# Patient Record
Sex: Female | Born: 1955 | Race: White | Hispanic: No | Marital: Married | Smoking: Never smoker
Health system: Southern US, Community
[De-identification: ages and names within clinical notes are randomized; demographics above are authoritative.]

## PROBLEM LIST (undated history)

## (undated) DIAGNOSIS — D649 Anemia, unspecified: Secondary | ICD-10-CM

## (undated) DIAGNOSIS — T7840XA Allergy, unspecified, initial encounter: Secondary | ICD-10-CM

## (undated) DIAGNOSIS — K5900 Constipation, unspecified: Secondary | ICD-10-CM

## (undated) HISTORY — DX: Anemia, unspecified: D64.9

## (undated) HISTORY — PX: BREAST LUMPECTOMY: SHX2

## (undated) HISTORY — PX: HEMORRHOID SURGERY: SHX153

## (undated) HISTORY — PX: CERVICAL CONE BIOPSY: SUR198

## (undated) HISTORY — DX: Allergy, unspecified, initial encounter: T78.40XA

## (undated) HISTORY — DX: Constipation, unspecified: K59.00

---

## 1997-12-20 ENCOUNTER — Ambulatory Visit (HOSPITAL_COMMUNITY): Admission: RE | Admit: 1997-12-20 | Discharge: 1997-12-20 | Payer: Self-pay | Admitting: *Deleted

## 1999-06-04 ENCOUNTER — Ambulatory Visit (HOSPITAL_COMMUNITY): Admission: RE | Admit: 1999-06-04 | Discharge: 1999-06-04 | Payer: Self-pay | Admitting: *Deleted

## 1999-06-04 ENCOUNTER — Encounter: Payer: Self-pay | Admitting: *Deleted

## 1999-06-05 ENCOUNTER — Ambulatory Visit (HOSPITAL_COMMUNITY): Admission: RE | Admit: 1999-06-05 | Discharge: 1999-06-05 | Payer: Self-pay

## 1999-06-05 ENCOUNTER — Encounter: Payer: Self-pay | Admitting: *Deleted

## 1999-07-31 ENCOUNTER — Other Ambulatory Visit: Admission: RE | Admit: 1999-07-31 | Discharge: 1999-07-31 | Payer: Self-pay | Admitting: General Surgery

## 1999-12-23 ENCOUNTER — Ambulatory Visit (HOSPITAL_COMMUNITY): Admission: RE | Admit: 1999-12-23 | Discharge: 1999-12-23 | Payer: Self-pay | Admitting: *Deleted

## 1999-12-23 ENCOUNTER — Encounter: Payer: Self-pay | Admitting: *Deleted

## 1999-12-26 ENCOUNTER — Other Ambulatory Visit: Admission: RE | Admit: 1999-12-26 | Discharge: 1999-12-26 | Payer: Self-pay | Admitting: *Deleted

## 2000-12-24 ENCOUNTER — Ambulatory Visit (HOSPITAL_COMMUNITY): Admission: RE | Admit: 2000-12-24 | Discharge: 2000-12-24 | Payer: Self-pay | Admitting: *Deleted

## 2000-12-24 ENCOUNTER — Encounter: Payer: Self-pay | Admitting: *Deleted

## 2001-09-14 ENCOUNTER — Ambulatory Visit (HOSPITAL_COMMUNITY): Admission: RE | Admit: 2001-09-14 | Discharge: 2001-09-14 | Payer: Self-pay | Admitting: General Surgery

## 2001-12-27 ENCOUNTER — Ambulatory Visit (HOSPITAL_COMMUNITY): Admission: RE | Admit: 2001-12-27 | Discharge: 2001-12-27 | Payer: Self-pay | Admitting: *Deleted

## 2001-12-27 ENCOUNTER — Encounter: Payer: Self-pay | Admitting: *Deleted

## 2003-03-02 ENCOUNTER — Other Ambulatory Visit: Admission: RE | Admit: 2003-03-02 | Discharge: 2003-03-02 | Payer: Self-pay | Admitting: Obstetrics and Gynecology

## 2004-05-20 ENCOUNTER — Other Ambulatory Visit: Admission: RE | Admit: 2004-05-20 | Discharge: 2004-05-20 | Payer: Self-pay | Admitting: Obstetrics and Gynecology

## 2005-01-15 ENCOUNTER — Ambulatory Visit (HOSPITAL_COMMUNITY): Admission: RE | Admit: 2005-01-15 | Discharge: 2005-01-15 | Payer: Self-pay | Admitting: Obstetrics and Gynecology

## 2005-03-13 ENCOUNTER — Emergency Department (HOSPITAL_COMMUNITY): Admission: EM | Admit: 2005-03-13 | Discharge: 2005-03-13 | Payer: Self-pay | Admitting: Emergency Medicine

## 2006-08-30 ENCOUNTER — Ambulatory Visit (HOSPITAL_COMMUNITY): Admission: RE | Admit: 2006-08-30 | Discharge: 2006-08-30 | Payer: Self-pay | Admitting: Obstetrics and Gynecology

## 2006-09-02 ENCOUNTER — Ambulatory Visit: Payer: Self-pay | Admitting: Gastroenterology

## 2006-09-02 HISTORY — PX: COLONOSCOPY: SHX174

## 2006-09-10 ENCOUNTER — Encounter: Admission: RE | Admit: 2006-09-10 | Discharge: 2006-09-10 | Payer: Self-pay | Admitting: Obstetrics and Gynecology

## 2006-09-15 ENCOUNTER — Ambulatory Visit: Payer: Self-pay | Admitting: Gastroenterology

## 2012-04-13 ENCOUNTER — Other Ambulatory Visit: Payer: Self-pay | Admitting: Obstetrics and Gynecology

## 2013-04-17 ENCOUNTER — Other Ambulatory Visit: Payer: Self-pay | Admitting: Obstetrics and Gynecology

## 2014-04-18 ENCOUNTER — Other Ambulatory Visit: Payer: Self-pay | Admitting: Obstetrics and Gynecology

## 2014-04-19 LAB — CYTOLOGY - PAP

## 2015-10-13 ENCOUNTER — Encounter (HOSPITAL_BASED_OUTPATIENT_CLINIC_OR_DEPARTMENT_OTHER): Payer: Self-pay

## 2015-10-13 ENCOUNTER — Emergency Department (HOSPITAL_BASED_OUTPATIENT_CLINIC_OR_DEPARTMENT_OTHER)
Admission: EM | Admit: 2015-10-13 | Discharge: 2015-10-13 | Disposition: A | Discharge status: Home / Self Care | Payer: 59 | Attending: Emergency Medicine | Admitting: Emergency Medicine

## 2015-10-13 DIAGNOSIS — Z79899 Other long term (current) drug therapy: Secondary | ICD-10-CM | POA: Insufficient documentation

## 2015-10-13 DIAGNOSIS — M545 Low back pain: Secondary | ICD-10-CM | POA: Diagnosis present

## 2015-10-13 DIAGNOSIS — Z791 Long term (current) use of non-steroidal anti-inflammatories (NSAID): Secondary | ICD-10-CM | POA: Diagnosis not present

## 2015-10-13 MED ORDER — ONDANSETRON 8 MG PO TBDP
8.0000 mg | ORAL_TABLET | Freq: Once | ORAL | Status: AC
  Administered 2015-10-13: 8 mg via ORAL
  Filled 2015-10-13: qty 1

## 2015-10-13 MED ORDER — HYDROCODONE-ACETAMINOPHEN 5-325 MG PO TABS: 1.0000 | ORAL_TABLET | Freq: Four times a day (QID) | ORAL | Status: DC | PRN

## 2015-10-13 MED ORDER — HYDROCODONE-ACETAMINOPHEN 5-325 MG PO TABS
1.0000 | ORAL_TABLET | Freq: Once | ORAL | Status: AC
  Administered 2015-10-13: 1 via ORAL
  Filled 2015-10-13: qty 1

## 2015-10-13 MED ORDER — ONDANSETRON 8 MG PO TBDP: 8.0000 mg | ORAL_TABLET | Freq: Three times a day (TID) | ORAL | Status: DC | PRN

## 2015-10-13 NOTE — Discharge Instructions (Signed)

## 2015-10-13 NOTE — ED Provider Notes (Signed)
CSN: [id number]     Arrival date & time 10/13/15  0133 History   First MD Initiated Contact with Patient 10/13/15 0243     Chief Complaint  Patient presents with  . Back Pain     (Consider location/radiation/quality/duration/timing/severity/associated sxs/prior Treatment) HPI  This is a 59 year old female who injured her back on December 2. She was painting and reached forward and had the sudden onset of pain in her lumbar region. She saw her PCP subsequently and was given a shot of steroids and placed on tizanidine and a nonsteroidal. She had some improvement until yesterday her pain acutely worsened. There was no new injury. She has difficulty localizing the pain is sometimes is more intense on the right and sometimes more intense on the left. The pain is worst when she attempts to stand from a sitting position. This causes severe pain. Pain is much milder when sitting or lying. There is no associated numbness or weakness of the lower extremities. There have been no bowel or bladder changes.  History reviewed. No pertinent past medical history. History reviewed. No pertinent past surgical history. History reviewed. No pertinent family history. Social History  Substance Use Topics  . Smoking status: Never Smoker   . Smokeless tobacco: None  . Alcohol Use: No   OB History    No data available     Review of Systems  All other systems reviewed and are negative.   Allergies  Codeine  Home Medications   Prior to Admission medications   Medication Sig Start Date End Date Taking? Authorizing Provider  Cetirizine HCl (ZYRTEC ALLERGY PO) Take by mouth.   Yes Historical Provider, MD  diclofenac (VOLTAREN) 75 MG EC tablet Take 75 mg by mouth 2 (two) times daily.   Yes Historical Provider, MD  geriatric multivitamins-minerals (ELDERTONIC/GEVRABON) ELIX Take 15 mLs by mouth daily.   Yes Historical Provider, MD  TIZANIDINE HCL PO Take by mouth.   Yes Historical Provider, MD   BP 167/87  mmHg  Pulse 68  Temp(Src) 98.4 F (36.9 C) (Oral)  Resp 20  Ht 5\' 9"  (1.753 m)  Wt 191 lb (86.637 kg)  BMI 28.19 kg/m2  SpO2 100%   Physical Exam  General: Well-developed, well-nourished female in no acute distress; appearance consistent with age of record HENT: normocephalic; atraumatic Eyes: pupils equal, round and reactive to light; extraocular muscles intact Neck: supple Heart: regular rate and rhythm Lungs: clear to auscultation bilaterally Abdomen: soft; nondistended; nontender; bowel sounds present Back: No lumbar tenderness; negative straight leg raise bilaterally; pain on standing Extremities: No deformity; full range of motion Neurologic: Awake, alert and oriented; motor function intact in all extremities and symmetric; sensation intact and symmetric in the lower extremities; no facial droop Skin: Warm and dry Psychiatric: Normal mood and affect    ED Course  Procedures (including critical care time)   MDM  We'll add a narcotic and refer her back to her PCP. She was advised that an MRI may be indicated this time.   Shanon Rosser, MD 10/13/15 [phone]

## 2015-10-13 NOTE — ED Notes (Signed)
"  feel better", denies questions or needs, given Rx x2, family at [facility].

## 2015-10-13 NOTE — ED Notes (Signed)
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 Aleve and muscle relaxer's are ineffective per patient.

## 2015-10-16 ENCOUNTER — Other Ambulatory Visit: Payer: Self-pay | Admitting: Family Medicine

## 2015-10-16 DIAGNOSIS — M545 Low back pain: Secondary | ICD-10-CM

## 2015-10-25 ENCOUNTER — Ambulatory Visit
Admission: RE | Admit: 2015-10-25 | Discharge: 2015-10-25 | Disposition: A | Discharge status: Home / Self Care | Payer: 59 | Source: Ambulatory Visit | Attending: Family Medicine | Admitting: Family Medicine

## 2015-10-25 DIAGNOSIS — M545 Low back pain: Secondary | ICD-10-CM

## 2016-06-15 ENCOUNTER — Encounter: Payer: Self-pay | Admitting: Gastroenterology

## 2016-08-06 ENCOUNTER — Encounter: Payer: Self-pay | Admitting: Gastroenterology

## 2016-10-02 ENCOUNTER — Ambulatory Visit (AMBULATORY_SURGERY_CENTER): Payer: Self-pay | Admitting: *Deleted

## 2016-10-02 ENCOUNTER — Encounter: Payer: Self-pay | Admitting: Gastroenterology

## 2016-10-02 VITALS — Ht 69.0 in | Wt 198.0 lb

## 2016-10-02 DIAGNOSIS — Z1211 Encounter for screening for malignant neoplasm of colon: Secondary | ICD-10-CM

## 2016-10-02 MED ORDER — NA SULFATE-K SULFATE-MG SULF 17.5-3.13-1.6 GM/177ML PO SOLN: 1.0000 | Freq: Once | ORAL | 0 refills | Status: AC

## 2016-10-02 NOTE — Progress Notes (Signed)
No egg or soy allergy known to patient  No issues with past sedation with any surgeries  or procedures, no intubation problems   diet pills per patient that contain phentermine- off x 10 days before colon  No home 02 use per patient  No blood thinners per patient  Pt denies issues with constipation  No A fib or A flutter  Constipation issues and poor prep last colon 2007 so 2 day prep - last exam states fair compromised exam

## 2016-10-16 ENCOUNTER — Encounter: Payer: Self-pay | Admitting: Gastroenterology

## 2016-10-16 ENCOUNTER — Ambulatory Visit (AMBULATORY_SURGERY_CENTER): Payer: 59 | Admitting: Gastroenterology

## 2016-10-16 VITALS — BP 129/73 | HR 65 | Temp 98.6°F | Resp 12 | Ht 69.0 in | Wt 198.0 lb

## 2016-10-16 DIAGNOSIS — D122 Benign neoplasm of ascending colon: Secondary | ICD-10-CM

## 2016-10-16 DIAGNOSIS — Z1211 Encounter for screening for malignant neoplasm of colon: Secondary | ICD-10-CM | POA: Diagnosis present

## 2016-10-16 DIAGNOSIS — Z1212 Encounter for screening for malignant neoplasm of rectum: Secondary | ICD-10-CM | POA: Diagnosis not present

## 2016-10-16 DIAGNOSIS — K635 Polyp of colon: Secondary | ICD-10-CM | POA: Diagnosis not present

## 2016-10-16 DIAGNOSIS — D124 Benign neoplasm of descending colon: Secondary | ICD-10-CM

## 2016-10-16 MED ORDER — SODIUM CHLORIDE 0.9 % IV SOLN: 500.0000 mL | INTRAVENOUS | Status: AC

## 2016-10-16 NOTE — Patient Instructions (Signed)
Impression/Recommendations:  Polyp handout given to patient. Hemorrhoid handout given to patient.  Repeat colonoscopy in 5-10 years based on pathology report.  YOU HAD AN ENDOSCOPIC PROCEDURE TODAY AT [facility]:   Refer to the procedure report that was given to you for any specific questions about what was found during the examination.  If the procedure report does not answer your questions, please call your gastroenterologist to clarify.  If you requested that your care partner not be given the details of your procedure findings, then the procedure report has been included in a sealed envelope for you to review at your convenience later.  YOU SHOULD EXPECT: Some feelings of bloating in the abdomen. Passage of more gas than usual.  Walking can help get rid of the air that was put into your GI tract during the procedure and reduce the bloating. If you had a lower endoscopy (such as a colonoscopy or flexible sigmoidoscopy) you may notice spotting of blood in your stool or on the toilet paper. If you underwent a bowel prep for your procedure, you may not have a normal bowel movement for a few days.  Please Note:  You might notice some irritation and congestion in your nose or some drainage.  This is from the oxygen used during your procedure.  There is no need for concern and it should clear up in a day or so.  SYMPTOMS TO REPORT IMMEDIATELY:   Following lower endoscopy (colonoscopy or flexible sigmoidoscopy):  Excessive amounts of blood in the stool  Significant tenderness or worsening of abdominal pains  Swelling of the abdomen that is new, acute  Fever of 100F or higher For urgent or emergent issues, a gastroenterologist can be reached at any hour by calling [phone].   DIET:  We do recommend a small meal at first, but then you may proceed to your regular diet.  Drink plenty of fluids but you should avoid alcoholic beverages for 24 hours.  ACTIVITY:  You should  plan to take it easy for the rest of today and you should NOT DRIVE or use heavy machinery until tomorrow (because of the sedation medicines used during the test).    FOLLOW UP: Our staff will call the number listed on your records the next business day following your procedure to check on you and address any questions or concerns that you may have regarding the information given to you following your procedure. If we do not reach you, we will leave a message.  However, if you are feeling well and you are not experiencing any problems, there is no need to return our call.  We will assume that you have returned to your regular daily activities without incident.  If any biopsies were taken you will be contacted by phone or by letter within the next 1-3 weeks.  Please call us at [phone] if you have not heard about the biopsies in 3 weeks.    SIGNATURES/CONFIDENTIALITY: You and/or your care partner have signed paperwork which will be entered into your electronic medical record.  These signatures attest to the fact that that the information above on your After Visit Summary has been reviewed and is understood.  Full responsibility of the confidentiality of this discharge information lies with you and/or your care-partner.

## 2016-10-16 NOTE — Op Note (Signed)
[place] [facility] Patient Name: Tiffany Orr Procedure Date: 10/16/2016 8:34 AM MRN: [id number] Endoscopist: Mauri Pole , MD Age: 60 Referring MD:  Date of Birth: 29-Jan-1956 Gender: Female Account #: [id number] Procedure:                Colonoscopy Indications:              Screening for colorectal malignant neoplasm, Last                            colonoscopy: 2007 Medicines:                Monitored Anesthesia Care Procedure:                Pre-Anesthesia Assessment:                           - Prior to the procedure, a History and Physical                            was performed, and patient medications and                            allergies were reviewed. The patient's tolerance of                            previous anesthesia was also reviewed. The risks                            and benefits of the procedure and the sedation                            options and risks were discussed with the patient.                            All questions were answered, and informed consent                            was obtained. Prior Anticoagulants: The patient has                            taken no previous anticoagulant or antiplatelet                            agents. ASA Grade Assessment: II - A patient with                            mild systemic disease. After reviewing the risks                            and benefits, the patient was deemed in                            satisfactory condition to undergo the procedure.  After obtaining informed consent, the colonoscope                            was passed under direct vision. Throughout the                            procedure, the patient's blood pressure, pulse, and                            oxygen saturations were monitored continuously. The                            Model CF-HQ190L [id number]) scope was introduced                            through the anus and advanced to  the the terminal                            ileum, with identification of the appendiceal                            orifice and IC valve. The colonoscopy was performed                            without difficulty. The patient tolerated the                            procedure well. The quality of the bowel                            preparation was good. The terminal ileum, ileocecal                            valve, appendiceal orifice, and rectum were                            photographed. Scope In: 8:42:14 AM Scope Out: 9:05:35 AM Scope Withdrawal Time: 0 hours 12 minutes 39 seconds  Total Procedure Duration: 0 hours 23 minutes 21 seconds  Findings:                 The perianal and digital rectal examinations were                            normal.                           A 6 mm polyp was found in the ascending colon. The                            polyp was sessile. The polyp was removed with a                            cold snare. Resection and retrieval were complete.  Two sessile polyps were found in the descending                            colon. The polyps were 2 to 3 mm in size. These                            polyps were removed with a cold biopsy forceps.                            Resection and retrieval were complete.                           Non-bleeding internal hemorrhoids were found during                            retroflexion. The hemorrhoids were small. Complications:            No immediate complications. Estimated Blood Loss:     Estimated blood loss was minimal. Impression:               - One 6 mm polyp in the ascending colon, removed                            with a cold snare. Resected and retrieved.                           - Two 2 to 3 mm polyps in the descending colon,                            removed with a cold biopsy forceps. Resected and                            retrieved.                           -  Non-bleeding internal hemorrhoids. Recommendation:           - Patient has a contact number available for                            emergencies. The signs and symptoms of potential                            delayed complications were discussed with the                            patient. Return to normal activities tomorrow.                            Written discharge instructions were provided to the                            patient.                           -  Resume previous diet.                           - Continue present medications.                           - Await pathology results.                           - Repeat colonoscopy in 5-10 years for surveillance                            based on pathology results. Mauri Pole, MD 10/16/2016 9:10:01 AM This report has been signed electronically.

## 2016-10-16 NOTE — Progress Notes (Signed)
A and O x3. Report to RN. Tolerated MAC anesthesia well. 

## 2016-10-16 NOTE — Progress Notes (Signed)
Called to room to assist during endoscopic procedure.  Patient ID and intended procedure confirmed with present staff. Received instructions for my participation in the procedure from the performing physician.  

## 2016-10-20 ENCOUNTER — Telehealth: Payer: Self-pay | Admitting: *Deleted

## 2016-10-20 NOTE — Telephone Encounter (Signed)
  Follow up Call-  Call back number 10/16/2016  Post procedure Call Back phone  # [phone]  Permission to leave phone message Yes  Some recent data might be hidden     Patient questions:  Do you have a fever, pain , or abdominal swelling? No. Pain Score  0 *  Have you tolerated food without any problems? Yes.    Have you been able to return to your normal activities? Yes.    Do you have any questions about your discharge instructions: Diet   No. Medications  No. Follow up visit  No.  Do you have questions or concerns about your Care? No.  Actions: * If pain score is 4 or above: No action needed, pain <4.

## 2016-10-22 ENCOUNTER — Encounter: Payer: Self-pay | Admitting: Gastroenterology

## 2018-02-28 ENCOUNTER — Ambulatory Visit
Admission: RE | Admit: 2018-02-28 | Discharge: 2018-02-28 | Disposition: A | Discharge status: Home / Self Care | Payer: 59 | Source: Ambulatory Visit | Attending: Family Medicine | Admitting: Family Medicine

## 2018-02-28 ENCOUNTER — Other Ambulatory Visit: Payer: Self-pay | Admitting: Family Medicine

## 2018-02-28 DIAGNOSIS — R52 Pain, unspecified: Secondary | ICD-10-CM

## 2018-07-22 IMAGING — CR DG KNEE COMPLETE 4+V*R*
4 series · 4 of 4 positions shown · non-contrast
Comparison: None.

CLINICAL DATA: Right knee pain for 1 week, no known injury, initial
encounter

EXAM:
RIGHT KNEE - COMPLETE 4+ VIEW

[w knee ap right]
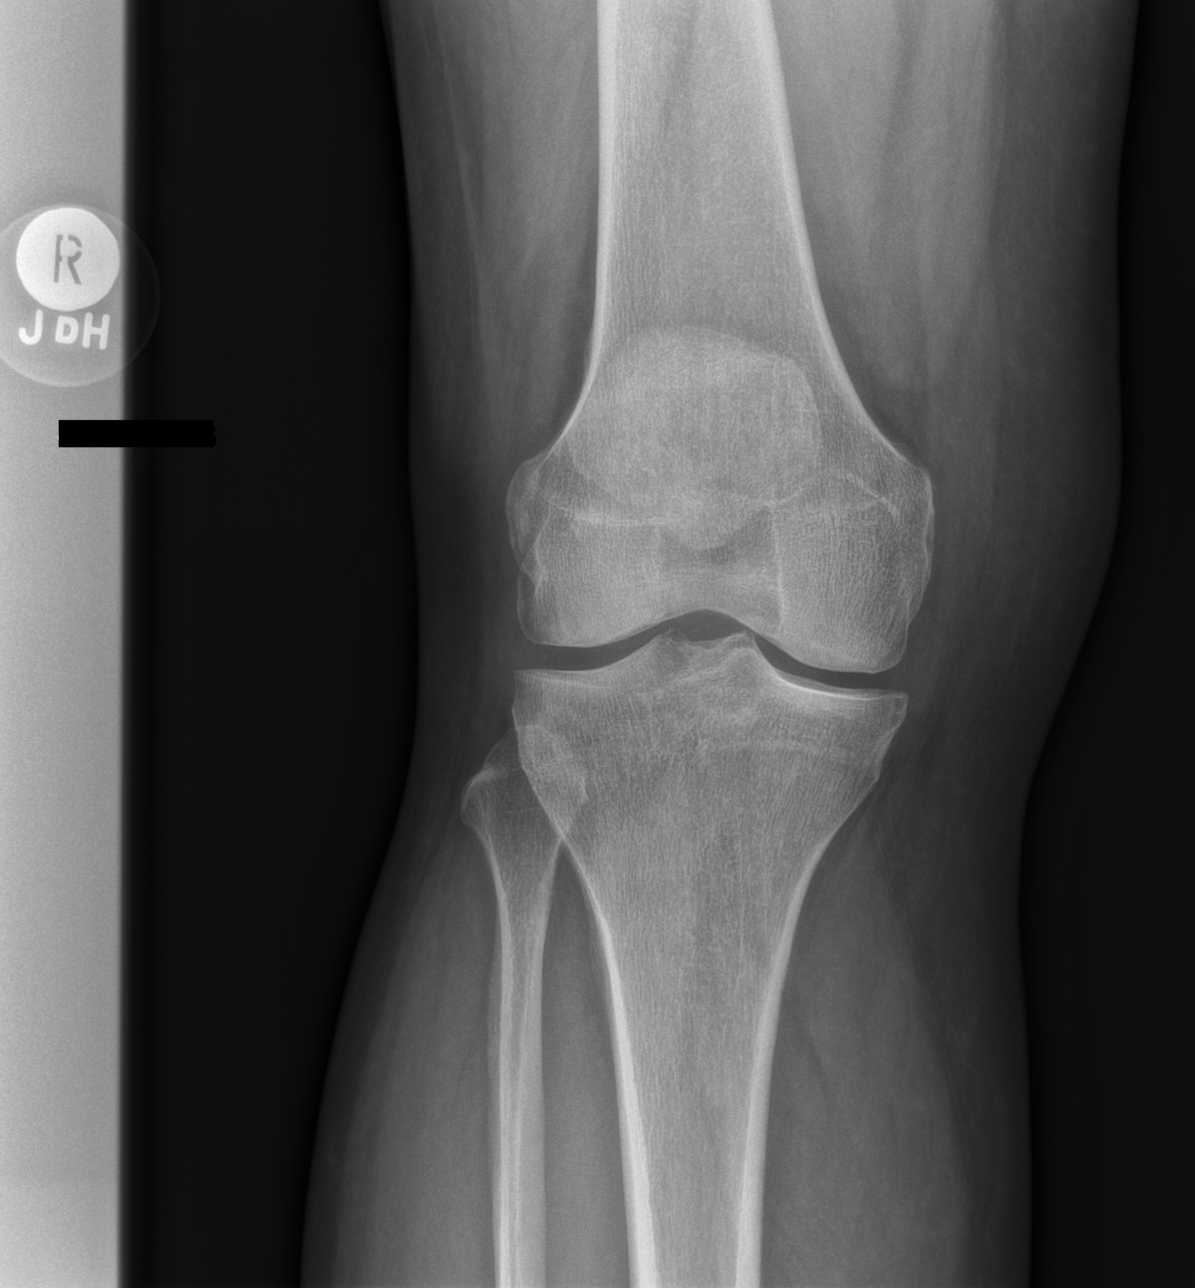

[w knee lat right]
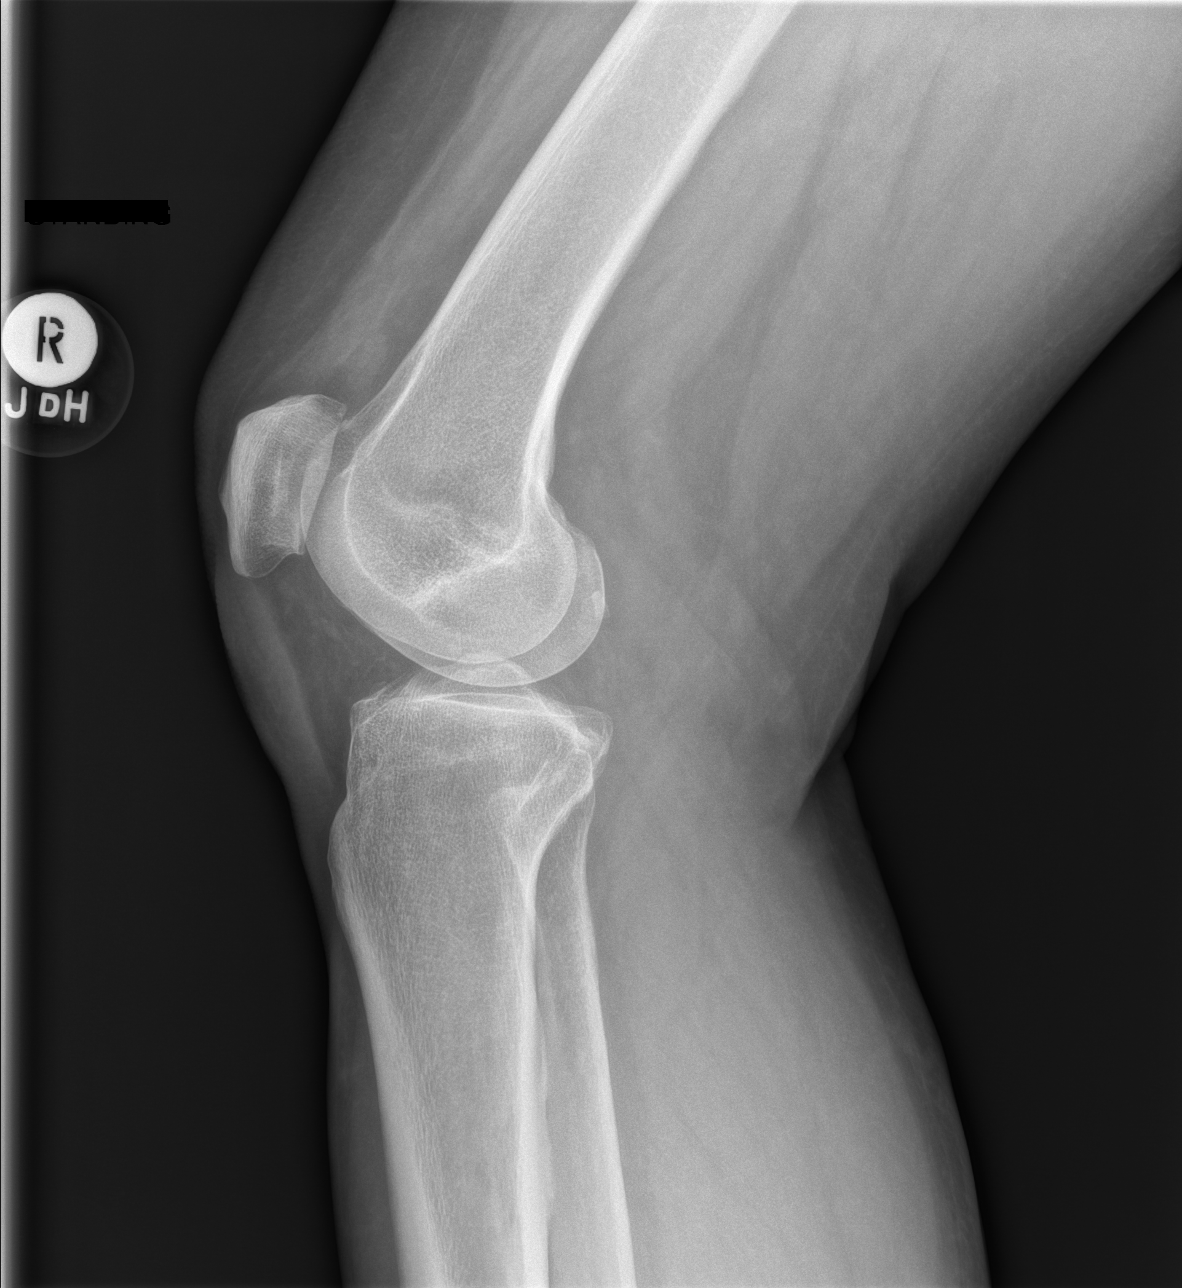

[x knee tunnel right]
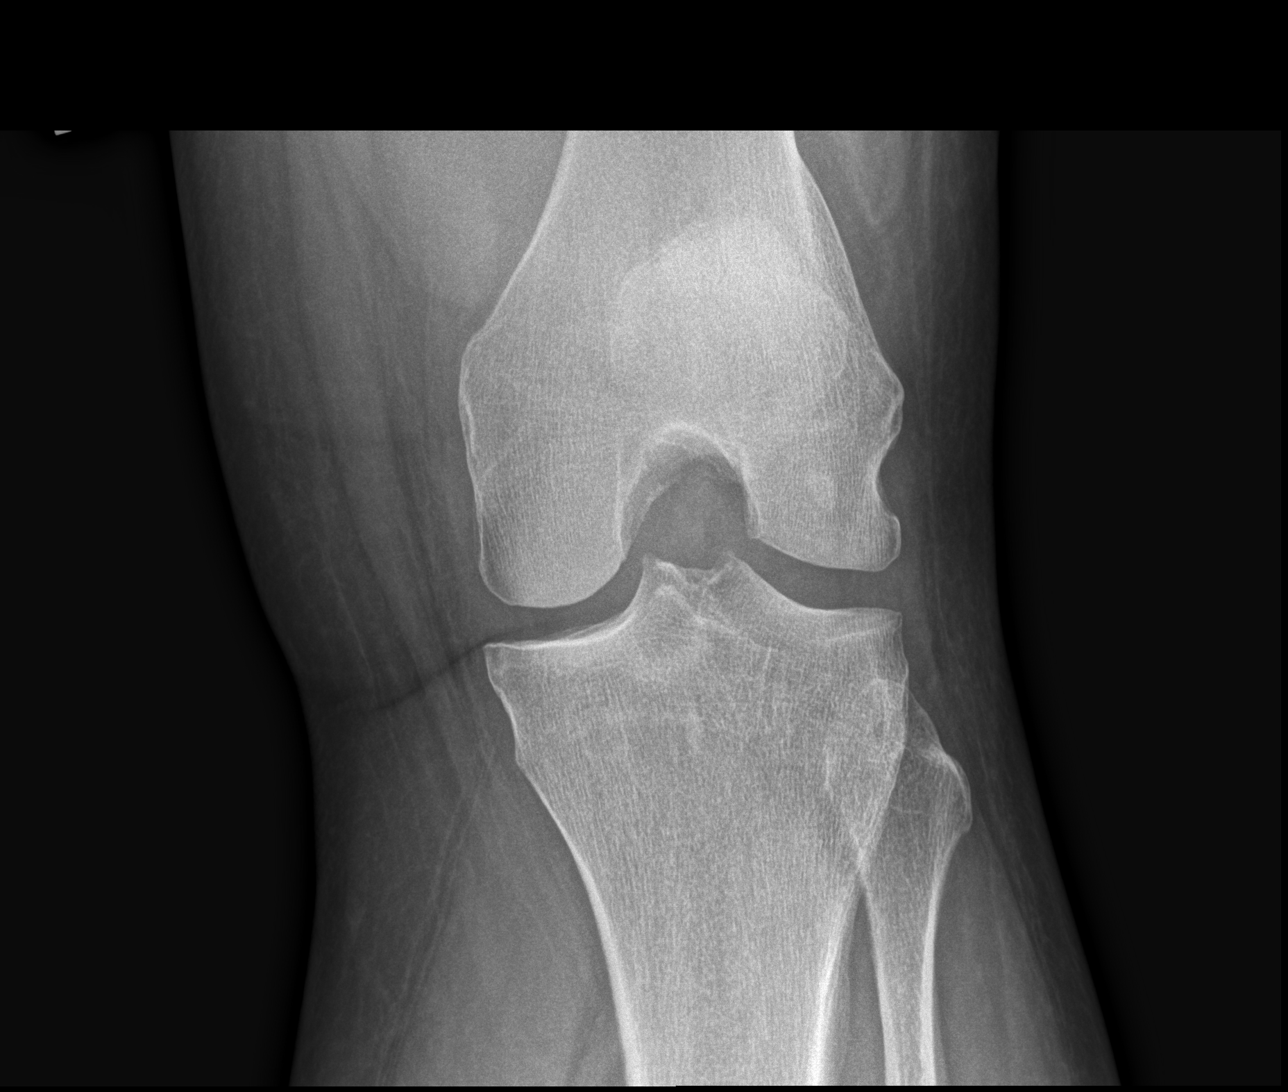

[x knee sunrise right]
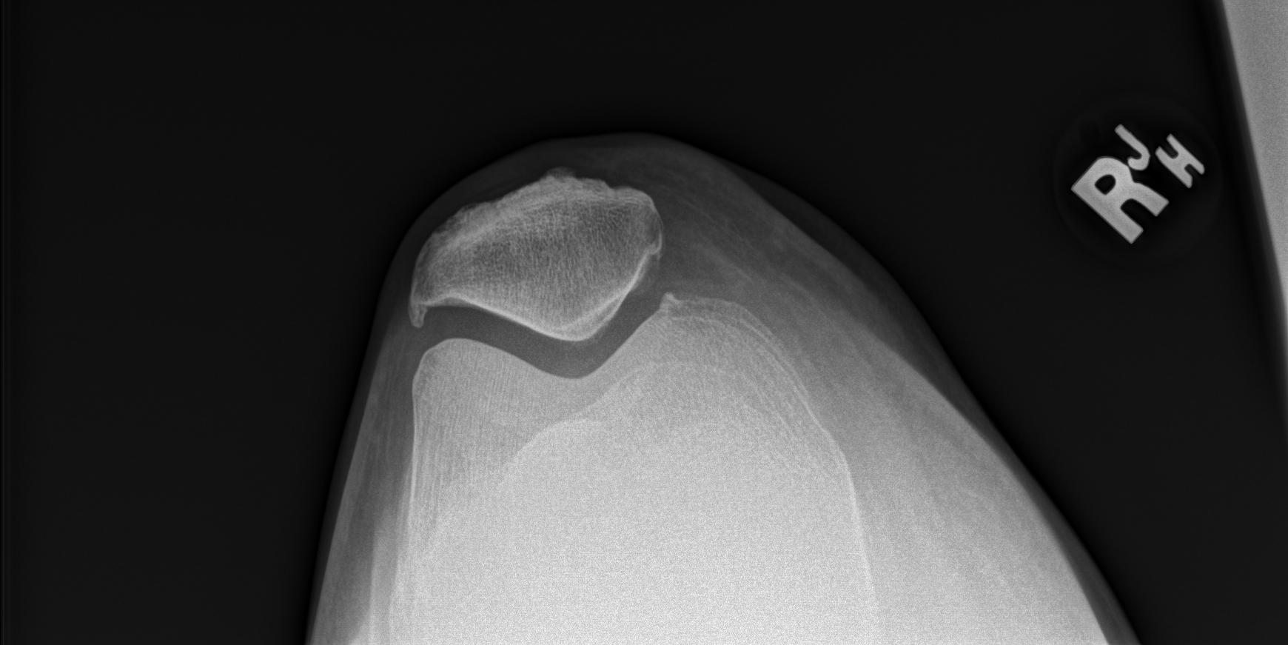

[4 of 4 positions shown; findings below may reference images not displayed]

FINDINGS: Very mild medial joint space narrowing is noted. No acute fracture
or dislocation is seen. Mild patellofemoral degenerative changes are
noted as well. No soft tissue changes are seen.
IMPRESSION: Mild degenerative change without acute abnormality.

## 2021-12-30 ENCOUNTER — Encounter: Payer: Self-pay | Admitting: Gastroenterology

## 2024-07-11 ENCOUNTER — Ambulatory Visit: Admitting: "Endocrinology

## 2024-07-20 ENCOUNTER — Ambulatory Visit (INDEPENDENT_AMBULATORY_CARE_PROVIDER_SITE_OTHER): Admitting: "Endocrinology

## 2024-07-20 ENCOUNTER — Encounter: Payer: Self-pay | Admitting: "Endocrinology

## 2024-07-20 VITALS — BP 140/80 | HR 74 | Ht 69.0 in | Wt 201.0 lb

## 2024-07-20 DIAGNOSIS — E559 Vitamin D deficiency, unspecified: Secondary | ICD-10-CM

## 2024-07-20 DIAGNOSIS — E1165 Type 2 diabetes mellitus with hyperglycemia: Secondary | ICD-10-CM | POA: Diagnosis not present

## 2024-07-20 DIAGNOSIS — E7800 Pure hypercholesterolemia, unspecified: Secondary | ICD-10-CM | POA: Diagnosis not present

## 2024-07-20 LAB — POCT GLYCOSYLATED HEMOGLOBIN (HGB A1C): Hemoglobin A1C: 6.5 % — AB (ref 4.0–5.6)

## 2024-07-20 NOTE — Progress Notes (Signed)
 Outpatient Endocrinology Note Tiffany Birmingham, MD  07/20/24   Tiffany Orr 1956-03-20 [id number]  Referring Provider: Mat Browning, MD Primary Care Provider: Debrah Josette ORN., PA-C Reason for consultation: Subjective   Assessment & Plan  Diagnoses and all orders for this visit:  Uncontrolled type 2 diabetes mellitus with hyperglycemia (HCC) -     Ambulatory referral to diabetic education -     POCT glycosylated hemoglobin (Hb A1C)  Pure hypercholesterolemia  Vitamin D deficiency   Vitamin D low at Vit D 23.9 in 02/2024 Recommend 2000 units of vitamin D daily  Diabetes Type II complicated by hyperglycemia, No results found for: GFR Hba1c goal less than 7, current Hba1c is  Lab Results  Component Value Date   HGBA1C 6.5 (A) 07/20/2024   Will recommend the following: Discussed lifestyle changes, including diet and exercise to improve the A1c over the next 3 months Discussed briefly about preventative diabetes medications Ordered diabetes education Order blood sugar meter with supplies to check as needed  No known contraindications/side effects to any of above medications  -Last LD and Tg are as follows: No results found for: LDLCALC No results found for: TRIG -not statin on mg every day, discussed about statin -Follow low fat diet and exercise   -Blood pressure goal <140/90 - Microalbumin/creatinine goal is < 30 -Last MA/Cr is as follows: No results found for: MICROALBUR, MALB24HUR -not on ACE/ARB  -diet changes including salt restriction -limit eating outside -counseled BP targets per standards of diabetes care -uncontrolled blood pressure can lead to retinopathy, nephropathy and cardiovascular and atherosclerotic heart disease  Reviewed and counseled on: -A1C target -Blood sugar targets -Complications of uncontrolled diabetes  -Checking blood sugar before meals and bedtime and bring log next visit -All medications with mechanism of  action and side effects -Hypoglycemia management: rule of 15's, Glucagon Emergency Kit and medical alert ID -low-carb low-fat plate-method diet -At least 20 minutes of physical activity per day -Annual dilated retinal eye exam and foot exam -compliance and follow up needs -follow up as scheduled or earlier if problem gets worse  Call if blood sugar is less than 70 or consistently above 250    Take a 15 gm snack of carbohydrate at bedtime before you go to sleep if your blood sugar is less than 100.    If you are going to fast after midnight for a test or procedure, ask your physician for instructions on how to reduce/decrease your insulin dose.    Call if blood sugar is less than 70 or consistently above 250  -Treating a low sugar by rule of 15  (15 gms of sugar every 15 min until sugar is more than 70) If you feel your sugar is low, test your sugar to be sure If your sugar is low (less than 70), then take 15 grams of a fast acting Carbohydrate (3-4 glucose tablets or glucose gel or 4 ounces of juice or regular soda) Recheck your sugar 15 min after treating low to make sure it is more than 70 If sugar is still less than 70, treat again with 15 grams of carbohydrate          Don't drive the hour of hypoglycemia  If unconscious/unable to eat or drink by mouth, use glucagon injection or nasal spray baqsimi and call 911. Can repeat again in 15 min if still unconscious.  No follow-ups on file.   I have reviewed current medications, nurse's notes, allergies, vital signs, past medical  and surgical history, family medical history, and social history for this encounter. Counseled patient on symptoms, examination findings, lab findings, imaging results, treatment decisions and monitoring and prognosis. The patient understood the recommendations and agrees with the treatment plan. All questions regarding treatment plan were fully answered.  Tiffany Birmingham, MD  07/20/24    History of Present  Illness Tiffany Orr is a 68 y.o. year old female who presents for evaluation of Type II diabetes mellitus.  [name] was first diagnosed in 02/2024.   Diabetes education -  Home diabetes regimen: none  COMPLICATIONS -  MI/Stroke -  retinopathy + neuropathy -  nephropathy  SYMPTOMS REVIEWED - Polyuria - Weight loss - Blurred vision  BLOOD SUGAR DATA Not checking blood sugars   Labs: 02/24/24 Hb 14.1 Fasting glucose 115 A1C 6.7 GFR 75 LDL 137, HDL 33, Tg 149, Chol 197 TSH 0.9 Vit D 23.9   Physical Exam  BP (!) 140/80   Pulse 74   Ht 5' 9 (1.753 m)   Wt 201 lb (91.2 kg)   SpO2 96%   BMI 29.68 kg/m    Constitutional: well developed, well nourished Head: normocephalic, atraumatic Eyes: sclera anicteric, no redness Neck: supple Lungs: normal respiratory effort Neurology: alert and oriented Skin: dry, no appreciable rashes Musculoskeletal: no appreciable defects Psychiatric: normal mood and affect Diabetic Foot Exam - Simple   No data filed      Current Medications Patient's Medications  New Prescriptions   No medications on file  Previous Medications   ASCORBIC ACID (VITAMIN C) 1000 MG TABLET    Take 1,000 mg by mouth daily.   CALCIUM-PHOSPHORUS-VITAMIN D (CALCIUM/VITAMIN D3/ADULT GUMMY) 250-100-500 MG-MG-UNIT CHEW    Chew 2 Units by mouth daily.   CETIRIZINE HCL (ZYRTEC ALLERGY PO)    Take by mouth.   CYANOCOBALAMIN (VITAMIN B 12 PO)    Take 2 Units by mouth daily. 2 gummies daily   DIPHENHYDRAMINE-ACETAMINOPHEN  (TYLENOL  PM) 25-500 MG TABS TABLET    Take 1 tablet by mouth at bedtime as needed.   MULTIPLE VITAMINS-MINERALS (HM MULTIVITAMIN ADULT GUMMY PO)    Take 1 Units by mouth daily.   NAPROXEN SODIUM (ALEVE PO)    Take by mouth as needed.   PHENTERMINE 15 MG CAPSULE    Take 15 mg by mouth every morning.  Modified Medications   No medications on file  Discontinued Medications   No medications on file    Allergies Allergies   Allergen Reactions   Codeine     Emesis     Past Medical History Past Medical History:  Diagnosis Date   Allergy    Anemia    as child   Constipation     Past Surgical History Past Surgical History:  Procedure Laterality Date   BREAST LUMPECTOMY     benign   CERVICAL CONE BIOPSY     COLONOSCOPY  09/02/2006   HEMORRHOID SURGERY      Family History family history includes Breast cancer in her paternal aunt; Kidney disease in her father; Pancreatic cancer in her mother.  Social History Social History   Socioeconomic History   Marital status: Married    Spouse name: Not on file   Number of children: Not on file   Years of education: Not on file   Highest education level: Not on file  Occupational History   Not on file  Tobacco Use   Smoking status: Never   Smokeless tobacco: Never  Substance and Sexual  Activity   Alcohol use: No   Drug use: No   Sexual activity: Not on file  Other Topics Concern   Not on file  Social History Narrative   Not on file   Social Drivers of Health   Financial Resource Strain: Not on file  Food Insecurity: Not on file  Transportation Needs: Not on file  Physical Activity: Not on file  Stress: Not on file  Social Connections: Not on file  Intimate Partner Violence: Not on file    Lab Results  Component Value Date   HGBA1C 6.5 (A) 07/20/2024   No results found for: CHOL No results found for: HDL No results found for: LDLCALC No results found for: TRIG No results found for: CHOLHDL No results found for: CREATININE No results found for: GFR No results found for: MICROALBUR, MALB24HUR No results found for: NA, K, CL, CO2, GLUCOSE, BUN, CREATININE, CALCIUM, PROT, ALBUMIN, AST, ALT, ALKPHOS, BILITOT, GFRNONAA, GFRAA     No data to display          No results found for: WBC, RBC, HGB, HCT, PLT, MCV, MCH, MCHC, RDW, LYMPHSABS, MONOABS, EOSABS,  BASOSABS   Parts of this note may have been dictated using voice recognition software. There may be variances in spelling and vocabulary which are unintentional. Not all errors are proofread. Please notify the dino if any discrepancies are noted or if the meaning of any statement is not clear.

## 2024-08-22 ENCOUNTER — Encounter: Attending: "Endocrinology | Admitting: Nutrition

## 2024-08-22 VITALS — Wt 198.0 lb

## 2024-08-22 DIAGNOSIS — E118 Type 2 diabetes mellitus with unspecified complications: Secondary | ICD-10-CM | POA: Diagnosis present

## 2024-08-22 NOTE — Progress Notes (Unsigned)
 Medical Nutrition Therapy  Appointment Start time:  1530  Appointment End time:  1630  Primary concerns today: New Onset Type 2 DM  Referral diagnosis: E11.8 Preferred learning style: no preference  Learning readiness: ready   NUTRITION ASSESSMENT  68 yr old wfemale referred for New onset Type 2 DM. Here with her husband. Sees Dr. Sande, Endocrinology. PCP Josette Aho FNP A1C 6.5%. NO medications right now. Wants to reverse her DM with diet and lifestyle changes. She is not testing blood sugars at present. Willing to contact PCP to order testing supplies and start testing 1 times per day alternating am and pm She reports a history of many  years drinking a lot of sodas. Doesn't drink any now. Drinks some water Eats 2-3 meals per day and snacks at night. Likes most foods. Test to not eat breakfast still 10-11 am. Not currently exercising but wants to get a 'rebounder' trampoline to use and has some other equipment. Eats at home some and eats out some. Stays up later and sleeps in til late mid morning. She is willing to embrace Lifestyle Medicine towards a more whole plant based diet.  Clinical Medical Hx:  Past Medical History:  Diagnosis Date   Allergy    Anemia    as child   Constipation     Medications:  Current Outpatient Medications on File Prior to Visit  Medication Sig Dispense Refill   Ascorbic Acid (VITAMIN C) 1000 MG tablet Take 1,000 mg by mouth daily.     Calcium-Phosphorus-Vitamin D (CALCIUM/VITAMIN D3/ADULT GUMMY) 250-100-500 MG-MG-UNIT CHEW Chew 2 Units by mouth daily.     Cetirizine HCl (ZYRTEC ALLERGY PO) Take by mouth.     Cyanocobalamin (VITAMIN B 12 PO) Take 2 Units by mouth daily. 2 gummies daily     diphenhydramine-acetaminophen  (TYLENOL  PM) 25-500 MG TABS tablet Take 1 tablet by mouth at bedtime as needed.     Multiple Vitamins-Minerals (HM MULTIVITAMIN ADULT GUMMY PO) Take 1 Units by mouth daily.     Naproxen Sodium (ALEVE PO) Take by mouth as  needed.     phentermine 15 MG capsule Take 15 mg by mouth every morning. (Patient not taking: Reported on 07/20/2024)     Current Facility-Administered Medications on File Prior to Visit  Medication Dose Route Frequency Provider Last Rate Last Admin   0.9 %  sodium chloride  infusion  500 mL Intravenous Continuous Nandigam, Kavitha V, MD        Labs:      No data to display         Lipid Panel  No results found for: CHOL, TRIG, HDL, CHOLHDL, VLDL, LDLCALC, LDLDIRECT, LABVLDL Lab Results  Component Value Date   HGBA1C 6.5 (A) 07/20/2024    Notable Signs/Symptoms: none  Lifestyle & Dietary Hx Lives with her husband. Retired.  Estimated daily fluid intake: 30 oz Supplements:  Sleep: good Stress / self-care:  Current average weekly physical activity: walks a little  24-Hr Dietary Recall First Meal: 11 am  2 coffee witih ohoney and nonfat dairy, 2 eggs, and 3 slices bacon and water Snack:  Second Meal: pretzels with chocolate and carmetl Snack: 5 pm BBQ sandwich with slaw 9 pm 2 cups greek yogurt with honey Beverages: water 30 oz of water per day   Estimated Energy Needs Calories: 1200-1500 Carbohydrate: 170g Protein: 112g Fat: 42g   NUTRITION DIAGNOSIS  NB-1.1 Food and nutrition-related knowledge deficit As related to inconsistent food intake and excessive carb intake .  As  evidenced by diet recall and A1C 6.5%.   NUTRITION INTERVENTION  Nutrition education (E-1) on the following topics:  Nutrition and Diabetes education provided on My Plate, CHO counting, meal planning, portion sizes, timing of meals, avoiding snacks between meals unless having a low blood sugar, target ranges for A1C and blood sugars, signs/symptoms and treatment of hyper/hypoglycemia, monitoring blood sugars, taking medications as prescribed, benefits of exercising 30 minutes per day and prevention of complications of DM.  Lifestyle Medicine  - Whole Food, Plant Predominant  Nutrition is highly recommended: Eat Plenty of vegetables, Mushrooms, fruits, Legumes, Whole Grains, Nuts, seeds in lieu of processed meats, processed snacks/pastries red meat, poultry, eggs.    -It is better to avoid simple carbohydrates including: Cakes, Sweet Desserts, Ice Cream, Soda (diet and regular), Sweet Tea, Candies, Chips, Cookies, Store Bought Juices, Alcohol in Excess of  1-2 drinks a day, Lemonade,  Artificial Sweeteners, Doughnuts, Coffee Creamers, Sugar-free Products, etc, etc.  This is not a complete list.....  Exercise: If you are able: 30 -60 minutes a day ,4 days a week, or 150 minutes a week.  The longer the better.  Combine stretch, strength, and aerobic activities.  If you were told in the past that you have high risk for cardiovascular diseases, you may seek evaluation by your heart doctor prior to initiating moderate to intense exercise programs.   Handouts Provided Include  What is Diabetes Know your numbers Checking blood sugars Lifestyle Medicine handouts  Learning Style & Readiness for Change Teaching method utilized: Visual & Auditory  Demonstrated degree of understanding via: Teach Back  Barriers to learning/adherence to lifestyle change: None  Goals Established by Pt Eat breakfast by 830 am, lunch 12-2 and Dinner 5-7 Do not eat after 7 pm Increase water intake to 64 oz per day Exercise at least 30 minutes 4 days per week Cut out snacks and processed foods. Increase fruits, vegetables, dried bean and whole grains-whole plant based foods. Test blood sugar alternating in the am before breafkast or 930 pm - at least 2 hours after dinner. Goals; am 80-130 mg/dl before breakfast and 100-150 before bedtime.    MONITORING & EVALUATION Dietary intake, weekly physical activity, and weight in 1 month.  Next Steps  Patient is to work on meal planning, meal prepping and exercise.SABRA

## 2024-08-23 ENCOUNTER — Encounter: Payer: Self-pay | Admitting: Nutrition

## 2024-08-23 NOTE — Patient Instructions (Signed)
 Goals Established by Pt Eat breakfast by 830 am, lunch 12-2 and Dinner 5-7 Do not eat after 7 pm Increase water intake to 64 oz per day Exercise at least 30 minutes 4 days per week Cut out snacks and processed foods. Increase fruits, vegetables, dried bean and whole grains-whole plant based foods. Test blood sugar alternating in the am before breafkast or 930 pm - at least 2 hours after dinner. Goals; am 80-130 mg/dl before breakfast and 100-150 before bedtime.

## 2024-09-18 ENCOUNTER — Encounter: Payer: Self-pay | Admitting: Nutrition

## 2024-09-18 ENCOUNTER — Encounter: Attending: "Endocrinology | Admitting: Nutrition

## 2024-09-18 VITALS — Ht 69.0 in | Wt 194.0 lb

## 2024-09-18 DIAGNOSIS — E118 Type 2 diabetes mellitus with unspecified complications: Secondary | ICD-10-CM | POA: Diagnosis present

## 2024-09-18 NOTE — Patient Instructions (Signed)
 Goals  Aim for 25 grams of fiber Use app to log foods to track foods Focus on whole plant based foods. Get A1C down to 5.7% or below Keep walking 2 miles per day

## 2024-09-18 NOTE — Progress Notes (Signed)
 Medical Nutrition Therapy  Appointment Start time:  1330  Appointment End time: 1345  Primary concerns today: New Onset Type 2 DM  Referral diagnosis: E11.8 Preferred learning style: no preference  Learning readiness: ready   NUTRITION ASSESSMENT  68 yr old wfemale referred for New onset Type 2 DM. Here with her husband. Sees Dr. Sande, Endocrinology. PCP Josette Aho FNP A1C 6.5%.  Not checking blood  4 lbs lost.  Changes: Increased vegetables intake. Increase protein Has been reading food labels. Getting in about 5000 steps a day Is less bloated.  Cut out juice, sodas, She is willing to embrace Lifestyle Medicine towards a more whole plant based diet. Goals Established by Pt last visit Eat breakfast by 830 am, lunch 12-2 and Dinner 5-7 done Do not eat after 7 pm- working on it Increase water intake to 64 oz per day done Exercise at least 30 minutes 4 days per week- done Cut out snacks and processed foods. done Increase fruits, vegetables, dried bean and whole grains-whole plant based foods.-improved Test blood sugar alternating in the am before breafkast or 930 pm - at least 2 hours after dinner. Not testing Goals; am 80-130 mg/dl before breakfast and 100-150 before bedtime. Clinical Medical Hx:  Past Medical History:  Diagnosis Date   Allergy    Anemia    as child   Constipation     Medications:  Current Outpatient Medications on File Prior to Visit  Medication Sig Dispense Refill   Ascorbic Acid (VITAMIN C) 1000 MG tablet Take 1,000 mg by mouth daily.     Calcium-Phosphorus-Vitamin D (CALCIUM/VITAMIN D3/ADULT GUMMY) 250-100-500 MG-MG-UNIT CHEW Chew 2 Units by mouth daily.     Cetirizine HCl (ZYRTEC ALLERGY PO) Take by mouth.     Cyanocobalamin (VITAMIN B 12 PO) Take 2 Units by mouth daily. 2 gummies daily     diphenhydramine-acetaminophen  (TYLENOL  PM) 25-500 MG TABS tablet Take 1 tablet by mouth at bedtime as needed.     Multiple Vitamins-Minerals (HM  MULTIVITAMIN ADULT GUMMY PO) Take 1 Units by mouth daily.     Naproxen Sodium (ALEVE PO) Take by mouth as needed.     phentermine 15 MG capsule Take 15 mg by mouth every morning. (Patient not taking: Reported on 07/20/2024)     Current Facility-Administered Medications on File Prior to Visit  Medication Dose Route Frequency Provider Last Rate Last Admin   0.9 %  sodium chloride  infusion  500 mL Intravenous Continuous Nandigam, Kavitha V, MD        Labs:      No data to display         Lipid Panel  No results found for: CHOL, TRIG, HDL, CHOLHDL, VLDL, LDLCALC, LDLDIRECT, LABVLDL Lab Results  Component Value Date   HGBA1C 6.5 (A) 07/20/2024    Notable Signs/Symptoms: none  Lifestyle & Dietary Hx Lives with her husband. Retired.  Estimated daily fluid intake: 30 oz Supplements:  Sleep: good Stress / self-care:  Current average weekly physical activity: walks a little  24-Hr Dietary Recall Eating 3 better balanced meals per day   Estimated Energy Needs Calories: 1200-1500 Carbohydrate: 170g Protein: 112g Fat: 42g   NUTRITION DIAGNOSIS  NB-1.1 Food and nutrition-related knowledge deficit As related to inconsistent food intake and excessive carb intake .  As evidenced by diet recall and A1C 6.5%.   NUTRITION INTERVENTION  Nutrition education (E-1) on the following topics:  Nutrition and Diabetes education provided on My Plate, CHO counting, meal planning, portion sizes,  timing of meals, avoiding snacks between meals unless having a low blood sugar, target ranges for A1C and blood sugars, signs/symptoms and treatment of hyper/hypoglycemia, monitoring blood sugars, taking medications as prescribed, benefits of exercising 30 minutes per day and prevention of complications of DM.  Lifestyle Medicine  - Whole Food, Plant Predominant Nutrition is highly recommended: Eat Plenty of vegetables, Mushrooms, fruits, Legumes, Whole Grains, Nuts, seeds in lieu of  processed meats, processed snacks/pastries red meat, poultry, eggs.    -It is better to avoid simple carbohydrates including: Cakes, Sweet Desserts, Ice Cream, Soda (diet and regular), Sweet Tea, Candies, Chips, Cookies, Store Bought Juices, Alcohol in Excess of  1-2 drinks a day, Lemonade,  Artificial Sweeteners, Doughnuts, Coffee Creamers, Sugar-free Products, etc, etc.  This is not a complete list.....  Exercise: If you are able: 30 -60 minutes a day ,4 days a week, or 150 minutes a week.  The longer the better.  Combine stretch, strength, and aerobic activities.  If you were told in the past that you have high risk for cardiovascular diseases, you may seek evaluation by your heart doctor prior to initiating moderate to intense exercise programs.   Handouts Provided Include  What is Diabetes Know your numbers Checking blood sugars Lifestyle Medicine handouts  Learning Style & Readiness for Change Teaching method utilized: Visual & Auditory  Demonstrated degree of understanding via: Teach Back  Barriers to learning/adherence to lifestyle change: None  Goals  Aim for 25 grams of fiber Use app to log foods to track foods Focus on whole plant based foods. Get A1C down to 5.7% or below Keep walking 2 miles per day   MONITORING & EVALUATION Dietary intake, weekly physical activity, and weight in 3 month.  Next Steps  Patient is to work on meal planning, meal prepping and exercise.SABRA

## 2024-10-10 ENCOUNTER — Encounter: Payer: Self-pay | Admitting: "Endocrinology

## 2024-10-10 ENCOUNTER — Ambulatory Visit: Admitting: "Endocrinology

## 2024-10-10 VITALS — BP 120/80 | HR 76 | Ht 69.0 in | Wt 191.0 lb

## 2024-10-10 DIAGNOSIS — E1165 Type 2 diabetes mellitus with hyperglycemia: Secondary | ICD-10-CM

## 2024-10-10 DIAGNOSIS — E559 Vitamin D deficiency, unspecified: Secondary | ICD-10-CM

## 2024-10-10 DIAGNOSIS — E7800 Pure hypercholesterolemia, unspecified: Secondary | ICD-10-CM | POA: Diagnosis not present

## 2024-10-10 MED ORDER — BLOOD GLUCOSE MONITORING SUPPL DEVI: 1.0000 | Freq: Three times a day (TID) | 0 refills | Status: DC

## 2024-10-10 MED ORDER — LANCET DEVICE MISC: 1.0000 | Freq: Three times a day (TID) | 0 refills | Status: DC

## 2024-10-10 MED ORDER — LANCETS MISC: 1.0000 | 0 refills | Status: AC

## 2024-10-10 MED ORDER — BLOOD GLUCOSE TEST VI STRP: 1.0000 | ORAL_STRIP | Freq: Three times a day (TID) | 3 refills | Status: DC

## 2024-10-10 NOTE — Patient Instructions (Signed)

## 2024-10-10 NOTE — Progress Notes (Signed)
 Outpatient Endocrinology Note Tiffany Birmingham, MD  10/10/2024   Tiffany Orr 03-05-1956 [id number]  Referring Provider: Debrah Orr ORN., PA-C Primary Care Provider: Debrah Orr ORN., PA-C Reason for consultation: Subjective   Assessment & Plan  Diagnoses and all orders for this visit:  Uncontrolled type 2 diabetes mellitus with hyperglycemia (HCC) -     Microalbumin / creatinine urine ratio  Other orders -     Blood Glucose Monitoring Suppl DEVI; 1 each by Does not apply route in the morning, at noon, and at bedtime. May substitute to any manufacturer covered by patient's insurance. -     Glucose Blood (BLOOD GLUCOSE TEST STRIPS) STRP; 1 each by In Vitro route in the morning, at noon, and at bedtime. May substitute to any manufacturer covered by patient's insurance. -     Lancet Device MISC; 1 each by Does not apply route in the morning, at noon, and at bedtime. May substitute to any manufacturer covered by patient's insurance. -     Lancets MISC; 1 each by Does not apply route as directed. Dispense based on patient and insurance preference. Use up to four times daily as directed. (FOR ICD-10 E10.9, E11.9).    Vitamin D low at Vit D 23.9 in 02/2024 Recommend 2000 units of vitamin D daily previously Ordered repeat lab  Diabetes Type II complicated by hyperglycemia, No results found for: GFR Hba1c goal less than 7, current Hba1c is  Lab Results  Component Value Date   HGBA1C 6.5 (A) 07/20/2024   Will recommend the following: Instructed to stay within 60 g of carbs per meal and 15 g carbs per snack Given handout for the various apps to help with carb count and calorie count Ordered blood sugar meter with supplies to check as needed as patient said she does not have it, instructed to check at least 1 time every day, and alternate the timings 8 AM labs before next visit  Previously, Discussed briefly about preventative diabetes medications Ordered diabetes  education  No known contraindications/side effects to any of above medications  -Last LD and Tg are as follows: No results found for: LDLCALC No results found for: TRIG -not statin on mg every day, discussed about statin -Follow low fat diet and exercise   -Blood pressure goal <140/90 - Microalbumin/creatinine goal is < 30 -Last MA/Cr is as follows: No results found for: MICROALBUR, MALB24HUR -not on ACE/ARB  -diet changes including salt restriction -limit eating outside -counseled BP targets per standards of diabetes care -uncontrolled blood pressure can lead to retinopathy, nephropathy and cardiovascular and atherosclerotic heart disease  Reviewed and counseled on: -A1C target -Blood sugar targets -Complications of uncontrolled diabetes  -Checking blood sugar before meals and bedtime and bring log next visit -All medications with mechanism of action and side effects -Hypoglycemia management: rule of 15's, Glucagon Emergency Kit and medical alert ID -low-carb low-fat plate-method diet -At least 20 minutes of physical activity per day -Annual dilated retinal eye exam and foot exam -compliance and follow up needs -follow up as scheduled or earlier if problem gets worse  Call if blood sugar is less than 70 or consistently above 250    Take a 15 gm snack of carbohydrate at bedtime before you go to sleep if your blood sugar is less than 100.    If you are going to fast after midnight for a test or procedure, ask your physician for instructions on how to reduce/decrease your insulin dose.  Call if blood sugar is less than 70 or consistently above 250  -Treating a low sugar by rule of 15  (15 gms of sugar every 15 min until sugar is more than 70) If you feel your sugar is low, test your sugar to be sure If your sugar is low (less than 70), then take 15 grams of a fast acting Carbohydrate (3-4 glucose tablets or glucose gel or 4 ounces of juice or regular soda) Recheck  your sugar 15 min after treating low to make sure it is more than 70 If sugar is still less than 70, treat again with 15 grams of carbohydrate          Don't drive the hour of hypoglycemia  If unconscious/unable to eat or drink by mouth, use glucagon injection or nasal spray baqsimi and call 911. Can repeat again in 15 min if still unconscious.  Return in about 6 weeks (around 11/21/2024) for visit and 8 am labs before next visit, urine test today.   I have reviewed current medications, nurse's notes, allergies, vital signs, past medical and surgical history, family medical history, and social history for this encounter. Counseled patient on symptoms, examination findings, lab findings, imaging results, treatment decisions and monitoring and prognosis. The patient understood the recommendations and agrees with the treatment plan. All questions regarding treatment plan were fully answered.  Tiffany Birmingham, MD  10/10/2024    History of Present Illness Tiffany Orr is a 68 y.o. year old female who presents for follow up of Type II diabetes mellitus.  Tiffany Orr was first diagnosed in 02/2024.   Diabetes education -  Home diabetes regimen: none  COMPLICATIONS -  MI/Stroke -  retinopathy + neuropathy -  nephropathy  SYMPTOMS REVIEWED - Polyuria - Weight loss - Blurred vision  BLOOD SUGAR DATA Not checking blood sugars   Labs: 02/24/24 Hb 14.1 Fasting glucose 115 A1C 6.7 GFR 75 LDL 137, HDL 33, Tg 149, Chol 197 TSH 0.9 Vit D 23.9   Physical Exam  BP 120/80   Pulse 76   Ht 5' 9 (1.753 m)   Wt 191 lb (86.6 kg)   SpO2 96%   BMI 28.21 kg/m    Constitutional: well developed, well nourished Head: normocephalic, atraumatic Eyes: sclera anicteric, no redness Neck: supple Lungs: normal respiratory effort Neurology: alert and oriented Skin: dry, no appreciable rashes Musculoskeletal: no appreciable defects Psychiatric: normal mood and affect Diabetic Foot  Exam - Simple   No data filed      Current Medications Patient's Medications  New Prescriptions   BLOOD GLUCOSE MONITORING SUPPL DEVI    1 each by Does not apply route in the morning, at noon, and at bedtime. May substitute to any manufacturer covered by patient's insurance.   GLUCOSE BLOOD (BLOOD GLUCOSE TEST STRIPS) STRP    1 each by In Vitro route in the morning, at noon, and at bedtime. May substitute to any manufacturer covered by patient's insurance.   LANCET DEVICE MISC    1 each by Does not apply route in the morning, at noon, and at bedtime. May substitute to any manufacturer covered by patient's insurance.   LANCETS MISC    1 each by Does not apply route as directed. Dispense based on patient and insurance preference. Use up to four times daily as directed. (FOR ICD-10 E10.9, E11.9).  Previous Medications   ASCORBIC ACID (VITAMIN C) 1000 MG TABLET    Take 1,000 mg by mouth daily.  CALCIUM-PHOSPHORUS-VITAMIN D (CALCIUM/VITAMIN D3/ADULT GUMMY) 250-100-500 MG-MG-UNIT CHEW    Chew 2 Units by mouth daily.   CETIRIZINE HCL (ZYRTEC ALLERGY PO)    Take by mouth.   CYANOCOBALAMIN (VITAMIN B 12 PO)    Take 2 Units by mouth daily. 2 gummies daily   DIPHENHYDRAMINE-ACETAMINOPHEN  (TYLENOL  PM) 25-500 MG TABS TABLET    Take 1 tablet by mouth at bedtime as needed.   MULTIPLE VITAMINS-MINERALS (HM MULTIVITAMIN ADULT GUMMY PO)    Take 1 Units by mouth daily.   NAPROXEN SODIUM (ALEVE PO)    Take by mouth as needed.   PHENTERMINE 15 MG CAPSULE    Take 15 mg by mouth every morning.  Modified Medications   No medications on file  Discontinued Medications   No medications on file    Allergies Allergies  Allergen Reactions   Codeine     Emesis     Past Medical History Past Medical History:  Diagnosis Date   Allergy    Anemia    as child   Constipation     Past Surgical History Past Surgical History:  Procedure Laterality Date   BREAST LUMPECTOMY     benign   CERVICAL CONE BIOPSY      COLONOSCOPY  09/02/2006   HEMORRHOID SURGERY      Family History family history includes Breast cancer in her paternal aunt; Kidney disease in her father; Pancreatic cancer in her mother.  Social History Social History   Socioeconomic History   Marital status: Married    Spouse name: Not on file   Number of children: Not on file   Years of education: Not on file   Highest education level: Not on file  Occupational History   Not on file  Tobacco Use   Smoking status: Never   Smokeless tobacco: Never  Substance and Sexual Activity   Alcohol use: No   Drug use: No   Sexual activity: Not on file  Other Topics Concern   Not on file  Social History Narrative   Not on file   Social Drivers of Health   Tobacco Use: Low Risk (10/10/2024)   Patient History    Smoking Tobacco Use: Never    Smokeless Tobacco Use: Never    Passive Exposure: Not on file  Financial Resource Strain: Not on file  Food Insecurity: Not on file  Transportation Needs: Not on file  Physical Activity: Not on file  Stress: Not on file  Social Connections: Not on file  Intimate Partner Violence: Not on file  Depression (EYV7-0): Not on file  Alcohol Screen: Not on file  Housing: Not on file  Utilities: Not on file  Health Literacy: Not on file    Lab Results  Component Value Date   HGBA1C 6.5 (A) 07/20/2024   No results found for: CHOL No results found for: HDL No results found for: LDLCALC No results found for: TRIG No results found for: CHOLHDL No results found for: CREATININE No results found for: GFR No results found for: MICROALBUR, MALB24HUR No results found for: NA, K, CL, CO2, GLUCOSE, BUN, CREATININE, CALCIUM, PROT, ALBUMIN, AST, ALT, ALKPHOS, BILITOT, GFRNONAA, GFRAA     No data to display          No results found for: WBC, RBC, HGB, HCT, PLT, MCV, MCH, MCHC, RDW, LYMPHSABS, MONOABS, EOSABS,  BASOSABS   Parts of this note may have been dictated using voice recognition software. There may be variances in spelling and vocabulary which are  unintentional. Not all errors are proofread. Please notify the dino if any discrepancies are noted or if the meaning of any statement is not clear.

## 2024-10-11 LAB — MICROALBUMIN / CREATININE URINE RATIO
Creatinine, Urine: 54 mg/dL (ref 20–275)
Microalb, Ur: <0.2 mg/dL

## 2024-10-24 ENCOUNTER — Telehealth: Payer: Self-pay | Admitting: Nutrition

## 2024-10-24 NOTE — Telephone Encounter (Signed)
 Pt. Is requesting that you call in script for blood sugar meter and test strips and lancets to her [facility] in [place]

## 2024-10-30 DIAGNOSIS — E1165 Type 2 diabetes mellitus with hyperglycemia: Secondary | ICD-10-CM

## 2024-10-30 MED ORDER — BLOOD GLUCOSE MONITORING SUPPL DEVI: 1.0000 | Freq: Three times a day (TID) | 0 refills | Status: AC

## 2024-10-30 MED ORDER — LANCET DEVICE MISC: 1.0000 | Freq: Three times a day (TID) | 0 refills | Status: AC

## 2024-10-30 MED ORDER — BLOOD GLUCOSE TEST VI STRP: 1.0000 | ORAL_STRIP | Freq: Three times a day (TID) | 3 refills | Status: AC

## 2024-10-30 NOTE — Telephone Encounter (Signed)
"  Sent in  "

## 2024-11-06 ENCOUNTER — Encounter: Attending: "Endocrinology | Admitting: Nutrition

## 2024-11-06 ENCOUNTER — Encounter: Payer: Self-pay | Admitting: Nutrition

## 2024-11-06 VITALS — Ht 69.0 in | Wt 192.0 lb

## 2024-11-06 DIAGNOSIS — E118 Type 2 diabetes mellitus with unspecified complications: Secondary | ICD-10-CM | POA: Insufficient documentation

## 2024-11-06 NOTE — Patient Instructions (Addendum)
 Do the Full Plate Living Program

## 2024-11-06 NOTE — Progress Notes (Signed)
 Medical Nutrition Therapy  Appointment Start time:  1300  Appointment End time: 1330  Primary concerns today: New Onset Type 2 DM  Referral diagnosis: E11.8 Preferred learning style: no preference  Learning readiness: ready   NUTRITION ASSESSMENT  DM Type 2 follow up 69 yr old wfemale referred for New onset Type 2 DM. Sees Dr. Sande, Endocrinology. FBS- 87-115 mg/dl Checks occasionally  afew times per week. Has been more aware of food choices, portion sizes. Has been really working hard on not eating past 7 pm. Has been walking more often. Lost 2 lbs. Feels much better. Eating much healthier. Struggles cooking for herself and then for husband who doesn't like the same foods. Sees Dr. Montwani in  few weeks to check A1C.  Goals met from last visit.  Aim for 25 grams of fiber-improved-improved. Use app to log foods to track foods-improved Focus on whole plant based foods.-doing Get A1C down to 5.7% or below- will get checked next md viist. Keep walking 2 miles per day-working on it.   Clinical Wt Readings from Last 3 Encounters:  11/06/24 192 lb (87.1 kg)  10/10/24 191 lb (86.6 kg)  09/18/24 194 lb (88 kg)   Ht Readings from Last 3 Encounters:  11/06/24 5' 9 (1.753 m)  10/10/24 5' 9 (1.753 m)  09/18/24 5' 9 (1.753 m)   Body mass index is 28.35 kg/m. @BMIFA @ Facility age limit for growth %iles is 20 years. Facility age limit for growth %iles is 20 years.  Medical Hx:  Past Medical History:  Diagnosis Date   Allergy    Anemia    as child   Constipation     Medications:  Current Outpatient Medications on File Prior to Visit  Medication Sig Dispense Refill   Ascorbic Acid (VITAMIN C) 1000 MG tablet Take 1,000 mg by mouth daily.     Blood Glucose Monitoring Suppl DEVI 1 each by Does not apply route in the morning, at noon, and at bedtime. May substitute to any manufacturer covered by patient's insurance. 1 each 0   Calcium-Phosphorus-Vitamin D (CALCIUM/VITAMIN  D3/ADULT GUMMY) 250-100-500 MG-MG-UNIT CHEW Chew 2 Units by mouth daily.     Cetirizine HCl (ZYRTEC ALLERGY PO) Take by mouth.     Cyanocobalamin (VITAMIN B 12 PO) Take 2 Units by mouth daily. 2 gummies daily     diphenhydramine-acetaminophen  (TYLENOL  PM) 25-500 MG TABS tablet Take 1 tablet by mouth at bedtime as needed.     Glucose Blood (BLOOD GLUCOSE TEST STRIPS) STRP 1 each by In Vitro route in the morning, at noon, and at bedtime. May substitute to any manufacturer covered by patient's insurance. 100 each 3   Lancet Device MISC 1 each by Does not apply route in the morning, at noon, and at bedtime. May substitute to any manufacturer covered by patient's insurance. 1 each 0   Lancets MISC 1 each by Does not apply route as directed. Dispense based on patient and insurance preference. Use up to four times daily as directed. (FOR ICD-10 E10.9, E11.9). 100 each 0   Multiple Vitamins-Minerals (HM MULTIVITAMIN ADULT GUMMY PO) Take 1 Units by mouth daily.     Naproxen Sodium (ALEVE PO) Take by mouth as needed.     phentermine 15 MG capsule Take 15 mg by mouth every morning.     Current Facility-Administered Medications on File Prior to Visit  Medication Dose Route Frequency Provider Last Rate Last Admin   0.9 %  sodium chloride  infusion  500 mL Intravenous  Continuous Nandigam, Kavitha V, MD        Labs:      No data to display         Lipid Panel  No results found for: CHOL, TRIG, HDL, CHOLHDL, VLDL, LDLCALC, LDLDIRECT, LABVLDL Lab Results  Component Value Date   HGBA1C 6.5 (A) 07/20/2024    Notable Signs/Symptoms: none  Lifestyle & Dietary Hx Lives with her husband. Retired.  Estimated daily fluid intake: 30 oz Supplements:  Sleep: good Stress / self-care:  Current average weekly physical activity: walks a little  24-Hr Dietary Recall  Scrambled eggs 2 with bacon- 2-3 slices, coffee Nuts as snack, water Lunch: chicken and lentil corn soup, water Dinner :  Grilled chicken broccoli, mushrooms, garlic, skillet potatoes, , water   Estimated Energy Needs Calories: 1200-1500 Carbohydrate: 170g Protein: 112g Fat: 42g   NUTRITION DIAGNOSIS  NB-1.1 Food and nutrition-related knowledge deficit As related to inconsistent food intake and excessive carb intake .  As evidenced by diet recall and A1C 6.5%.   NUTRITION INTERVENTION  Nutrition education (E-1) on the following topics:  Nutrition and Diabetes education provided on My Plate, CHO counting, meal planning, portion sizes, timing of meals, avoiding snacks between meals unless having a low blood sugar, target ranges for A1C and blood sugars, signs/symptoms and treatment of hyper/hypoglycemia, monitoring blood sugars, taking medications as prescribed, benefits of exercising 30 minutes per day and prevention of complications of DM.  Lifestyle Medicine  - Whole Food, Plant Predominant Nutrition is highly recommended: Eat Plenty of vegetables, Mushrooms, fruits, Legumes, Whole Grains, Nuts, seeds in lieu of processed meats, processed snacks/pastries red meat, poultry, eggs.    -It is better to avoid simple carbohydrates including: Cakes, Sweet Desserts, Ice Cream, Soda (diet and regular), Sweet Tea, Candies, Chips, Cookies, Store Bought Juices, Alcohol in Excess of  1-2 drinks a day, Lemonade,  Artificial Sweeteners, Doughnuts, Coffee Creamers, Sugar-free Products, etc, etc.  This is not a complete list.....  Exercise: If you are able: 30 -60 minutes a day ,4 days a week, or 150 minutes a week.  The longer the better.  Combine stretch, strength, and aerobic activities.  If you were told in the past that you have high risk for cardiovascular diseases, you may seek evaluation by your heart doctor prior to initiating moderate to intense exercise programs.   Handouts Provided Include   Lifestyle Medicine handouts  Learning Style & Readiness for Change Teaching method utilized: Visual & Auditory   Demonstrated degree of understanding via: Teach Back  Barriers to learning/adherence to lifestyle change: None  Goals Keep up the great job.  Do the Full Plate Living Program on line. Call or schedule appointment if needed.   MONITORING & EVALUATION Dietary intake, weekly physical activity, and weight in  PRN Next Steps  Patient is to work on meal planning, meal prepping and exercise.SABRA

## 2024-11-15 ENCOUNTER — Other Ambulatory Visit

## 2024-11-16 LAB — VITAMIN D 25 HYDROXY (VIT D DEFICIENCY, FRACTURES): Vit D, 25-Hydroxy: 38 ng/mL (ref 30–100)

## 2024-11-22 ENCOUNTER — Encounter: Payer: Self-pay | Admitting: "Endocrinology

## 2024-11-22 ENCOUNTER — Ambulatory Visit: Admitting: "Endocrinology

## 2024-11-22 VITALS — BP 136/82 | HR 60 | Resp 16 | Ht 69.0 in | Wt 190.8 lb

## 2024-11-22 DIAGNOSIS — Z8639 Personal history of other endocrine, nutritional and metabolic disease: Secondary | ICD-10-CM | POA: Diagnosis not present

## 2024-11-22 DIAGNOSIS — E7800 Pure hypercholesterolemia, unspecified: Secondary | ICD-10-CM | POA: Diagnosis not present

## 2024-11-22 LAB — POCT GLYCOSYLATED HEMOGLOBIN (HGB A1C): Hemoglobin A1C: 5.6 % (ref 4.0–5.6)

## 2024-11-22 NOTE — Patient Instructions (Signed)

## 2024-11-22 NOTE — Progress Notes (Signed)
 "   Outpatient Endocrinology Note Obadiah Birmingham, MD  11/22/24   SILENA WYSS 1956-01-23 [id number]  Referring Provider: Debrah Josette ORN., PA-C Primary Care Provider: Debrah Josette ORN., PA-C Reason for consultation: Subjective   Assessment & Plan  Tiffany Orr was seen today for diabetes.  Diagnoses and all orders for this visit:  History of diabetes mellitus -     POCT glycosylated hemoglobin (Hb A1C)   Vitamin D  low at Vit D 23.9 in 02/2024 Takes 2000 units of vitamin D  daily 3x/week Ordered repeat lab WNL  Diabetes Type II confirmed with 2 elevated A1Cs (02/24/24 fasting glucose 115, A1C 6.7 and 07/20/24 A1C at 6.5) now normalized with diet, No results found for: GFR Hba1c goal less than 7, current Hba1c is  Lab Results  Component Value Date   HGBA1C 5.6 11/22/2024   Will recommend the following: Patient is tracking her diet on free app: recording on Fitness Pal: reviewed data from app Discussed ways to improve diet Reinforced 60 g of carbs per meal, and maximum 1800 cal/day, patient is currently reaching 200+ carbs per day and about 2000 cal/day Previously, Instructed to stay within 60 g of carbs per meal and 15 g carbs per snack Given handout for the various apps to help with carb count and calorie count Ordered blood sugar meter with supplies to check as needed as patient said she does not have it, instructed to check at least 1 time every day, and alternate the timings Discussed briefly about preventative diabetes medications Ordered diabetes education  No known contraindications/side effects to any of above medications  -Last LD and Tg are as follows: No results found for: LDLCALC No results found for: TRIG -not statin on mg every day, discussed about statin -11/22/2024: Patient is again not interested in statin, discussed about it, discussed ways to improve LDL and the goal of LDL being less than 70 given history of diabetes -Follow low fat diet and  exercise   -Blood pressure goal <140/90 - Microalbumin/creatinine goal is < 30 -Last MA/Cr is as follows: Lab Results  Component Value Date   MICROALBUR <0.2 10/10/2024   -not on ACE/ARB  -diet changes including salt restriction -limit eating outside -counseled BP targets per standards of diabetes care -uncontrolled blood pressure can lead to retinopathy, nephropathy and cardiovascular and atherosclerotic heart disease  Reviewed and counseled on: -A1C target -Blood sugar targets -Complications of uncontrolled diabetes  -Checking blood sugar before meals and bedtime and bring log next visit -All medications with mechanism of action and side effects -Hypoglycemia management: rule of 15's, Glucagon Emergency Kit and medical alert ID -low-carb low-fat plate-method diet -At least 20 minutes of physical activity per day -Annual dilated retinal eye exam and foot exam -compliance and follow up needs -follow up as scheduled or earlier if problem gets worse  Call if blood sugar is less than 70 or consistently above 250    Take a 15 gm snack of carbohydrate at bedtime before you go to sleep if your blood sugar is less than 100.    If you are going to fast after midnight for a test or procedure, ask your physician for instructions on how to reduce/decrease your insulin dose.    Call if blood sugar is less than 70 or consistently above 250  -Treating a low sugar by rule of 15  (15 gms of sugar every 15 min until sugar is more than 70) If you feel your sugar is low, test your sugar to  be sure If your sugar is low (less than 70), then take 15 grams of a fast acting Carbohydrate (3-4 glucose tablets or glucose gel or 4 ounces of juice or regular soda) Recheck your sugar 15 min after treating low to make sure it is more than 70 If sugar is still less than 70, treat again with 15 grams of carbohydrate          Don't drive the hour of hypoglycemia  If unconscious/unable to eat or drink by  mouth, use glucagon injection or nasal spray baqsimi and call 911. Can repeat again in 15 min if still unconscious.  No follow-ups on file. Patient plans to follow-up with her PCP  I have reviewed current medications, nurse's notes, allergies, vital signs, past medical and surgical history, family medical history, and social history for this encounter. Counseled patient on symptoms, examination findings, lab findings, imaging results, treatment decisions and monitoring and prognosis. The patient understood the recommendations and agrees with the treatment plan. All questions regarding treatment plan were fully answered.  Obadiah Birmingham, MD  11/22/24    History of Present Illness AURIANNA EARLYWINE is a 69 y.o. year old female who presents for follow up of Type II diabetes mellitus.  Charisse Wendell Cristo was first diagnosed in 02/2024.   Diabetes education -  Home diabetes regimen: none  COMPLICATIONS -  MI/Stroke -  retinopathy + neuropathy -  nephropathy  BLOOD SUGAR DATA Not checking blood sugars   Labs: 02/24/24 Hb 14.1 Fasting glucose 115 A1C 6.7 GFR 75 LDL 137, HDL 33, Tg 149, Chol 197 TSH 0.9 Vit D 23.9   Physical Exam  BP 136/82   Pulse 60   Resp 16   Ht 5' 9 (1.753 m)   Wt 190 lb 12.8 oz (86.5 kg)   SpO2 99%   BMI 28.18 kg/m    Constitutional: well developed, well nourished Head: normocephalic, atraumatic Eyes: sclera anicteric, no redness Neck: supple Lungs: normal respiratory effort Neurology: alert and oriented Skin: dry, no appreciable rashes Musculoskeletal: no appreciable defects Psychiatric: normal mood and affect Diabetic Foot Exam - Simple   No data filed      Current Medications Patient's Medications  New Prescriptions   No medications on file  Previous Medications   ASCORBIC ACID (VITAMIN C) 1000 MG TABLET    Take 1,000 mg by mouth daily.   BARBERRY-OREG GRAPE-GOLDENSEAL (BERBERINE COMPLEX PO)    Take by mouth.   BLOOD GLUCOSE  MONITORING SUPPL DEVI    1 each by Does not apply route in the morning, at noon, and at bedtime. May substitute to any manufacturer covered by patient's insurance.   CALCIUM-PHOSPHORUS-VITAMIN D  (CALCIUM/VITAMIN D3/ADULT GUMMY) 250-100-500 MG-MG-UNIT CHEW    Chew 2 Units by mouth daily.   CETIRIZINE HCL (ZYRTEC ALLERGY PO)    Take by mouth.   CYANOCOBALAMIN (VITAMIN B 12 PO)    Take 2 Units by mouth daily. 2 gummies daily   DIPHENHYDRAMINE-ACETAMINOPHEN  (TYLENOL  PM) 25-500 MG TABS TABLET    Take 1 tablet by mouth at bedtime as needed.   GLUCOSE BLOOD (BLOOD GLUCOSE TEST STRIPS) STRP    1 each by In Vitro route in the morning, at noon, and at bedtime. May substitute to any manufacturer covered by patient's insurance.   LANCET DEVICE MISC    1 each by Does not apply route in the morning, at noon, and at bedtime. May substitute to any manufacturer covered by patient's insurance.   LANCETS MISC  1 each by Does not apply route as directed. Dispense based on patient and insurance preference. Use up to four times daily as directed. (FOR ICD-10 E10.9, E11.9).   MAGNESIUM GLYCINATE 120 MG CAPS    Take by mouth.   MULTIPLE VITAMIN (MULTIVITAMIN WITH MINERALS) TABS TABLET    Take 1 tablet by mouth daily.   NAPROXEN SODIUM (ALEVE PO)    Take by mouth as needed.   PHENTERMINE 15 MG CAPSULE    Take 15 mg by mouth every morning.  Modified Medications   No medications on file  Discontinued Medications   No medications on file    Allergies Allergies  Allergen Reactions   Codeine     Emesis     Past Medical History Past Medical History:  Diagnosis Date   Allergy    Anemia    as child   Constipation     Past Surgical History Past Surgical History:  Procedure Laterality Date   BREAST LUMPECTOMY     benign   CERVICAL CONE BIOPSY     COLONOSCOPY  09/02/2006   HEMORRHOID SURGERY      Family History family history includes Breast cancer in her paternal aunt; Kidney disease in her father;  Pancreatic cancer in her mother.  Social History Social History   Socioeconomic History   Marital status: Married    Spouse name: Not on file   Number of children: Not on file   Years of education: Not on file   Highest education level: Not on file  Occupational History   Not on file  Tobacco Use   Smoking status: Never   Smokeless tobacco: Never  Substance and Sexual Activity   Alcohol use: No   Drug use: No   Sexual activity: Not on file  Other Topics Concern   Not on file  Social History Narrative   Not on file   Social Drivers of Health   Tobacco Use: Low Risk (11/22/2024)   Patient History    Smoking Tobacco Use: Never    Smokeless Tobacco Use: Never    Passive Exposure: Not on file  Financial Resource Strain: Not on file  Food Insecurity: Not on file  Transportation Needs: Not on file  Physical Activity: Not on file  Stress: Not on file  Social Connections: Not on file  Intimate Partner Violence: Not on file  Depression (EYV7-0): Not on file  Alcohol Screen: Not on file  Housing: Not on file  Utilities: Not on file  Health Literacy: Not on file    Lab Results  Component Value Date   HGBA1C 5.6 11/22/2024   HGBA1C 6.5 (A) 07/20/2024   No results found for: CHOL No results found for: HDL No results found for: LDLCALC No results found for: TRIG No results found for: CHOLHDL No results found for: CREATININE No results found for: GFR Lab Results  Component Value Date   MICROALBUR <0.2 10/10/2024   No results found for: NA, K, CL, CO2, GLUCOSE, BUN, CREATININE, CALCIUM, PROT, ALBUMIN, AST, ALT, ALKPHOS, BILITOT, GFRNONAA, GFRAA     No data to display          No results found for: WBC, RBC, HGB, HCT, PLT, MCV, MCH, MCHC, RDW, LYMPHSABS, MONOABS, EOSABS, BASOSABS   Parts of this note may have been dictated using voice recognition software. There may be variances in spelling  and vocabulary which are unintentional. Not all errors are proofread. Please notify the dino if any discrepancies are noted or if  the meaning of any statement is not clear.   "
